# Patient Record
Sex: Male | Born: 1985 | Race: White | Hispanic: No | Marital: Single | Smoking: Former smoker
Health system: Southern US, Community
[De-identification: ages and names within clinical notes are randomized; demographics above are authoritative.]

## PROBLEM LIST (undated history)

## (undated) DIAGNOSIS — L719 Rosacea, unspecified: Secondary | ICD-10-CM

## (undated) HISTORY — PX: TONSILLECTOMY: SUR1361

## (undated) HISTORY — DX: Rosacea, unspecified: L71.9

## (undated) HISTORY — PX: WRIST SURGERY: SHX841

## (undated) HISTORY — PX: APPENDECTOMY: SHX54

---

## 2003-01-06 ENCOUNTER — Emergency Department (HOSPITAL_COMMUNITY): Admission: EM | Admit: 2003-01-06 | Discharge: 2003-01-06 | Payer: Self-pay | Admitting: Emergency Medicine

## 2009-01-16 ENCOUNTER — Encounter: Admission: RE | Admit: 2009-01-16 | Discharge: 2009-01-16 | Payer: Self-pay | Admitting: Chiropractic Medicine

## 2009-01-17 ENCOUNTER — Emergency Department (HOSPITAL_COMMUNITY): Admission: EM | Admit: 2009-01-17 | Discharge: 2009-01-17 | Payer: Self-pay | Admitting: Emergency Medicine

## 2009-01-21 ENCOUNTER — Ambulatory Visit (HOSPITAL_BASED_OUTPATIENT_CLINIC_OR_DEPARTMENT_OTHER): Admission: RE | Admit: 2009-01-21 | Discharge: 2009-01-21 | Payer: Self-pay | Admitting: Orthopedic Surgery

## 2010-07-02 ENCOUNTER — Ambulatory Visit (HOSPITAL_BASED_OUTPATIENT_CLINIC_OR_DEPARTMENT_OTHER): Admission: RE | Admit: 2010-07-02 | Payer: Self-pay | Admitting: Orthopedic Surgery
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
## 2016-07-02 ENCOUNTER — Ambulatory Visit (HOSPITAL_COMMUNITY)
Admission: EM | Admit: 2016-07-02 | Discharge: 2016-07-02 | Disposition: A | Discharge status: Home / Self Care | Payer: BLUE CROSS/BLUE SHIELD | Attending: Internal Medicine | Admitting: Internal Medicine

## 2016-07-02 ENCOUNTER — Ambulatory Visit (INDEPENDENT_AMBULATORY_CARE_PROVIDER_SITE_OTHER): Payer: BLUE CROSS/BLUE SHIELD

## 2016-07-02 ENCOUNTER — Encounter (HOSPITAL_COMMUNITY): Payer: Self-pay | Admitting: *Deleted

## 2016-07-02 DIAGNOSIS — M79641 Pain in right hand: Secondary | ICD-10-CM

## 2016-07-02 DIAGNOSIS — M79642 Pain in left hand: Secondary | ICD-10-CM | POA: Diagnosis not present

## 2016-07-02 DIAGNOSIS — S63502A Unspecified sprain of left wrist, initial encounter: Secondary | ICD-10-CM | POA: Diagnosis not present

## 2016-07-02 DIAGNOSIS — M545 Low back pain, unspecified: Secondary | ICD-10-CM

## 2016-07-02 DIAGNOSIS — W009XXA Unspecified fall due to ice and snow, initial encounter: Secondary | ICD-10-CM

## 2016-07-02 DIAGNOSIS — M25522 Pain in left elbow: Secondary | ICD-10-CM | POA: Diagnosis not present

## 2016-07-02 DIAGNOSIS — S39012A Strain of muscle, fascia and tendon of lower back, initial encounter: Secondary | ICD-10-CM

## 2016-07-02 DIAGNOSIS — M2550 Pain in unspecified joint: Secondary | ICD-10-CM

## 2016-07-02 DIAGNOSIS — M25552 Pain in left hip: Secondary | ICD-10-CM | POA: Diagnosis not present

## 2016-07-02 MED ORDER — KETOROLAC TROMETHAMINE 60 MG/2ML IM SOLN
INTRAMUSCULAR | Status: AC
  Filled 2016-07-02: qty 2

## 2016-07-02 MED ORDER — CYCLOBENZAPRINE HCL 10 MG PO TABS: 10.0000 mg | ORAL_TABLET | Freq: Two times a day (BID) | ORAL | 0 refills | Status: DC | PRN

## 2016-07-02 MED ORDER — IBUPROFEN 600 MG PO TABS: 600.0000 mg | ORAL_TABLET | Freq: Four times a day (QID) | ORAL | 0 refills | Status: DC | PRN

## 2016-07-02 MED ORDER — KETOROLAC TROMETHAMINE 60 MG/2ML IM SOLN
60.0000 mg | Freq: Once | INTRAMUSCULAR | Status: AC
  Administered 2016-07-02: 60 mg via INTRAMUSCULAR

## 2016-07-02 NOTE — ED Provider Notes (Signed)
CSN: [id number]     Arrival date & time 07/02/16  1314 History   First MD Initiated Contact with Patient 07/02/16 1500     Chief Complaint  Patient presents with  . Fall   (Consider location/radiation/quality/duration/timing/severity/associated sxs/prior Treatment) HPI  Kevin Braun is a 30 y.o. male presenting to UC with c/o multiple areas of pain after slip and fall on ice 2 days ago.  Pt notes pain has worsened so his manager at work recommended he come be evaluated.  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  Pain is worse in his Left hand and wrist, Left elbow, Left lower back and Left hip.  Pt also notes he exacerbated pain in his Right little finger from an injury 3 months ago.  He has taken tylenol but nothing else for pain.  He has been wearing a wrist splint on Left hand/wrist with mild relief. He reports having surgery on shattered Left wrist about 5-6 years ago.       History reviewed. No pertinent past medical history. Past Surgical History:  Procedure Laterality Date  . WRIST SURGERY     History reviewed. No pertinent family history. Social History  Substance Use Topics  . Smoking status: Current Some Day Smoker  . Smokeless tobacco: Not on file  . Alcohol use Yes    Review of Systems  Musculoskeletal: Positive for arthralgias, back pain, gait problem, joint swelling ( Left hand/wrist) and myalgias. Negative for neck pain and neck stiffness.  Skin: Negative for color change, rash and wound.  Neurological: Negative for weakness and numbness.    Allergies  Patient has no known allergies.  Home Medications   Prior to Admission medications   Medication Sig Start Date End Date Taking? Authorizing Provider  FLUoxetine HCl (PROZAC PO) Take by mouth.   Yes Historical Provider, MD  MINOCYCLINE HCL PO Take by mouth.   Yes Historical Provider, MD  TRAZODONE HCL PO Take by mouth.   Yes Historical Provider, MD   cyclobenzaprine (FLEXERIL) 10 MG tablet Take 1 tablet (10 mg total) by mouth 2 (two) times daily as needed for muscle spasms. 07/02/16   Junius Finner, PA-C  ibuprofen (ADVIL,MOTRIN) 600 MG tablet Take 1 tablet (600 mg total) by mouth every 6 (six) hours as needed. 07/02/16   Junius Finner, PA-C   Meds Ordered and Administered this Visit   Medications  ketorolac (TORADOL) injection 60 mg (60 mg Intramuscular Given 07/02/16 1548)    BP 132/74 (BP Location: Right Arm)   Pulse 72   Temp 98.6 F (37 C) (Oral)   Resp 16   SpO2 100%  No data found.   Physical Exam  Constitutional: He is oriented to person, place, and time. He appears well-developed and well-nourished.  HENT:  Head: Normocephalic and atraumatic.  Eyes: EOM are normal.  Neck: Normal range of motion. Neck supple.  No midline bone tenderness, no crepitus or step-offs.  Cardiovascular: Normal rate.   Pulses:      Radial pulses are 2+ on the right side, and 2+ on the left side.  Pulmonary/Chest: Effort normal.  Musculoskeletal: He exhibits edema and tenderness.  Left wrist/hand: mild edema, limited flexion and extension. Left elbow: full ROM, mild tenderness over olecranon process. Lower lumbar spine: mild tenderness, to spine and Left side lumbar muscles. Left hip: tender, full ROM w/o crepitus. Right hand: mild tenderness to little finger, slight decreased ROM, mild deformity  at 5th MCP joint.  Neurological: He is alert and oriented to person, place, and time.  Skin: Skin is warm and dry. Capillary refill takes less than 2 seconds.  Skin in tact, no ecchymosis or erythema.   Psychiatric: He has a normal mood and affect. His behavior is normal.  Nursing note and vitals reviewed.   Urgent Care Course   Clinical Course     Procedures (including critical care time)  Labs Review Labs Reviewed - No data to display  Imaging Review Dg Lumbar Spine Complete  Result Date: 07/02/2016 CLINICAL DATA:  Recent fall  on ice 2 days ago, low back pain EXAM: LUMBAR SPINE - COMPLETE 4+ VIEW COMPARISON:  07/02/2016 FINDINGS: There is no evidence of lumbar spine fracture. Alignment is normal. Intervertebral disc spaces are maintained. IMPRESSION: No acute finding or malalignment. Electronically Signed   By: Judie PetitM.  Shick M.D.   On: 07/02/2016 15:57   Dg Elbow Complete Left  Result Date: 07/02/2016 CLINICAL DATA:  Recent fall on ice, left elbow pain EXAM: LEFT ELBOW - COMPLETE 3+ VIEW COMPARISON:  07/02/2016 FINDINGS: There is no evidence of fracture, dislocation, or joint effusion. There is no evidence of arthropathy or other focal bone abnormality. Soft tissues are unremarkable. IMPRESSION: Negative. Electronically Signed   By: Judie PetitM.  Shick M.D.   On: 07/02/2016 15:59   Dg Wrist Complete Left  Result Date: 07/02/2016 CLINICAL DATA:  Recent fall on ice, left wrist pain, previous left distal radius ORIF. EXAM: LEFT WRIST - COMPLETE 3+ VIEW COMPARISON:  01/16/2009 FINDINGS: Previous left distal radius ORIF. Normal alignment. No acute osseous finding or fracture. No definite soft tissue or joint abnormality. Distal radius, ulna and carpal bones appear intact. No hardware abnormality. IMPRESSION: No acute osseous finding.  Remote left distal radius ORIF Electronically Signed   By: Judie PetitM.  Shick M.D.   On: 07/02/2016 15:56   Dg Hand Complete Left  Result Date: 07/02/2016 CLINICAL DATA:  Recent fall on ice 2 days ago, pain EXAM: LEFT HAND - COMPLETE 3+ VIEW COMPARISON:  07/02/2016 FINDINGS: Previous left distal radius ORIF. Normal alignment. No acute osseous finding. No definite soft tissue or joint abnormality. IMPRESSION: No acute osseous finding. Electronically Signed   By: Judie PetitM.  Shick M.D.   On: 07/02/2016 15:54   Dg Hand Complete Right  Result Date: 07/02/2016 CLINICAL DATA:  Recent fall on ice, right hand pain EXAM: RIGHT HAND - COMPLETE 3+ VIEW COMPARISON:  07/02/2016 FINDINGS: Healed fracture with deformity of the right fifth  metacarpal distally. No acute osseous finding or acute fracture. No soft tissue or joint abnormality. Normal alignment. No arthropathy. IMPRESSION: Healed right fifth metacarpal fracture distally with mild deformity. No acute osseous finding. Electronically Signed   By: Judie PetitM.  Shick M.D.   On: 07/02/2016 16:01   Dg Hip Unilat W Or Wo Pelvis 2-3 Views Left  Result Date: 07/02/2016 CLINICAL DATA:  Recent fall 2 days ago on ice, low back and left hip pain EXAM: DG HIP (WITH OR WITHOUT PELVIS) 2-3V LEFT COMPARISON:  07/02/2016 FINDINGS: There is no evidence of hip fracture or dislocation. There is no evidence of arthropathy or other focal bone abnormality. IMPRESSION: No acute osseous finding. Electronically Signed   By: Judie PetitM.  Shick M.D.   On: 07/02/2016 15:58     MDM   1. Fall due to slipping on ice or snow, initial encounter   2. Right hand pain   3. Left wrist sprain, initial encounter   4. Multiple joint pain  5. Acute left-sided low back pain without sciatica   6. Back strain, initial encounter    Pt c/o multiple areas of pain after slip and fall on ice about 2 days ago. Pain is worse on Left wrist and hand as well as Left lower back.  Plain films: No evidence of acute bony injury   Reassured pt no new fractures. Will treat wrist pain as a sprain. New wrist splint applied as one from home appeared to fit too tight. Home care instructions provided. Encouraged gentle stretching, alternating ice and heat. Rx: ibuprofen and flexeril (advised not to drive, operate heavy machinery or drink alcohol while taking due to drowsiness) [facility] or PCP in 1-2 weeks if not improving.   Junius FinnerErin O'Malley, PA-C 07/02/16 1640

## 2016-07-02 NOTE — ED Triage Notes (Signed)
Fell  [place] the  [employer]   2  Days  Ago     Pt  Reports   Lower back   l   Elbow   And   l wrist       And  Pain  In  The  l  Hip     Pt  Ambulated  To    Room    With    Slow  Halting  Gait

## 2016-07-02 NOTE — Discharge Instructions (Signed)
°  Flexeril is a muscle relaxer and may cause drowsiness. Do not drink alcohol, drive, or operate heavy machinery while taking.  Your x-rays today did not show any new fractures or injuries. Pain is likely due to ligament and muscle strains from the fall.  Strains and sprains typically take a few weeks to heal completely. Avoid heavy lifting or sudden movements while sore. Be sure to alternate cool and warm compresses. Cool compresses with with inflammation while warm compresses help keep muscles from tightening up. Be sure to gently warm up before doing daily stretching.  Avoid complete bed rest as that will cause muscles to stiffen up, worsening pain.  Your Left wrist is not broken so you may take the splint off throughout the day to wash your hands and do gentle stretching exercises.  If still hurting in 1-2 weeks, you should follow up with your primary care provider or orthopedist.

## 2017-06-14 ENCOUNTER — Ambulatory Visit: Payer: Self-pay | Admitting: Adult Health Nurse Practitioner

## 2017-06-14 DIAGNOSIS — M25511 Pain in right shoulder: Secondary | ICD-10-CM | POA: Insufficient documentation

## 2017-06-14 DIAGNOSIS — Z0000 Encounter for general adult medical examination without abnormal findings: Secondary | ICD-10-CM

## 2017-06-14 DIAGNOSIS — G8929 Other chronic pain: Secondary | ICD-10-CM

## 2017-06-14 MED ORDER — CYCLOBENZAPRINE HCL 5 MG PO TABS: 10.0000 mg | ORAL_TABLET | Freq: Every evening | ORAL | 0 refills | Status: DC | PRN
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
## 2017-06-15 LAB — COMPREHENSIVE METABOLIC PANEL
A/G RATIO: 1.8 (ref 1.2–2.2)
ALBUMIN: 4.9 g/dL (ref 3.5–5.5)
ALT: 27 IU/L (ref 0–44)
AST: 26 IU/L (ref 0–40)
Alkaline Phosphatase: 91 IU/L (ref 39–117)
BUN/Creatinine Ratio: 9 (ref 9–20)
BUN: 9 mg/dL (ref 6–20)
Bilirubin Total: 0.2 mg/dL (ref 0.0–1.2)
CALCIUM: 10.3 mg/dL — AB (ref 8.7–10.2)
CO2: 25 mmol/L (ref 20–29)
CREATININE: 0.98 mg/dL (ref 0.76–1.27)
Chloride: 100 mmol/L (ref 96–106)
GFR, EST AFRICAN AMERICAN: 117 mL/min/{1.73_m2} (ref >59)
GFR, EST NON AFRICAN AMERICAN: 101 mL/min/{1.73_m2} (ref >59)
GLOBULIN, TOTAL: 2.7 g/dL (ref 1.5–4.5)
Glucose: 89 mg/dL (ref 65–99)
POTASSIUM: 4.6 mmol/L (ref 3.5–5.2)
Sodium: 144 mmol/L (ref 134–144)
TOTAL PROTEIN: 7.6 g/dL (ref 6.0–8.5)

## 2017-06-15 LAB — CBC
HEMATOCRIT: 42.7 % (ref 37.5–51.0)
HEMOGLOBIN: 15.5 g/dL (ref 13.0–17.7)
MCH: 31.8 pg (ref 26.6–33.0)
MCHC: 36.3 g/dL — ABNORMAL HIGH (ref 31.5–35.7)
MCV: 88 fL (ref 79–97)
Platelets: 382 10*3/uL — ABNORMAL HIGH (ref 150–379)
RBC: 4.87 x10E6/uL (ref 4.14–5.80)
RDW: 13.1 % (ref 12.3–15.4)
WBC: 8 10*3/uL (ref 3.4–10.8)

## 2017-06-15 LAB — LIPID PANEL
Chol/HDL Ratio: 4.3 ratio (ref 0.0–5.0)
Cholesterol, Total: 187 mg/dL (ref 100–199)
HDL: 44 mg/dL (ref >39)
LDL Calculated: 90 mg/dL (ref 0–99)
TRIGLYCERIDES: 266 mg/dL — AB (ref 0–149)
VLDL CHOLESTEROL CAL: 53 mg/dL — AB (ref 5–40)

## 2017-06-15 LAB — TSH: TSH: 2.96 u[IU]/mL (ref 0.450–4.500)

## 2017-06-15 LAB — HEMOGLOBIN A1C
ESTIMATED AVERAGE GLUCOSE: 103 mg/dL
HEMOGLOBIN A1C: 5.2 % (ref 4.8–5.6)

## 2017-06-27 ENCOUNTER — Ambulatory Visit: Payer: BLUE CROSS/BLUE SHIELD

## 2017-06-29 ENCOUNTER — Ambulatory Visit: Payer: Self-pay | Admitting: Specialist

## 2017-07-06 ENCOUNTER — Ambulatory Visit: Payer: Self-pay | Admitting: Specialist

## 2017-07-06 DIAGNOSIS — M25519 Pain in unspecified shoulder: Secondary | ICD-10-CM
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
## 2017-07-13 ENCOUNTER — Ambulatory Visit: Payer: Self-pay | Admitting: Pharmacy Technician

## 2017-07-13 NOTE — Progress Notes (Signed)
Patient scheduled for eligibility appointment at [facility].  Patient did not show for the appointment on July 13, 2017 at 9:00a.m.Marland Kitchen.  Patient did not reschedule eligibility appointment.  [facility] unable to provide additional medication assistance until eligibility is determined.  Sherilyn DacostaBetty J. Juliza Machnik Care Manager [facility]

## 2017-07-14 ENCOUNTER — Telehealth: Payer: Self-pay | Admitting: Pharmacy Technician

## 2017-07-14 NOTE — Telephone Encounter (Signed)
Kevin MannanCarolyn Braun from [facility] that [facility] is no longer a resident at [facility].  Attempted to notify patient.  Patient needs to provide poi.  Patient has not returned phone call.  [facility] unable to provide medication assistance until patient provides current poi.  Kevin DacostaBetty J. Braun Held Care Manager [facility]

## 2017-11-22 ENCOUNTER — Telehealth: Payer: Self-pay | Admitting: Adult Health Nurse Practitioner

## 2017-11-22 NOTE — Telephone Encounter (Signed)
Individual from ministry called to give new phone number for the patient. Phone number has been updated in the system.

## 2019-11-08 ENCOUNTER — Ambulatory Visit
Admission: EM | Admit: 2019-11-08 | Discharge: 2019-11-08 | Disposition: A | Discharge status: Home / Self Care | Payer: 59 | Attending: Emergency Medicine | Admitting: Emergency Medicine

## 2019-11-08 ENCOUNTER — Ambulatory Visit (INDEPENDENT_AMBULATORY_CARE_PROVIDER_SITE_OTHER): Payer: 59

## 2019-11-08 DIAGNOSIS — M79641 Pain in right hand: Secondary | ICD-10-CM | POA: Diagnosis not present

## 2019-11-08 DIAGNOSIS — S6721XA Crushing injury of right hand, initial encounter: Secondary | ICD-10-CM

## 2019-11-08 DIAGNOSIS — W278XXA Contact with other nonpowered hand tool, initial encounter: Secondary | ICD-10-CM

## 2019-11-08 MED ORDER — NAPROXEN 500 MG PO TABS: 500.0000 mg | ORAL_TABLET | Freq: Two times a day (BID) | ORAL | 0 refills | Status: DC

## 2019-11-08 NOTE — ED Provider Notes (Signed)
[facility]    CSN: [id number] Arrival date & time: 11/08/19  1930      History   Chief Complaint Chief Complaint  Patient presents with  . Hand Pain    HPI Kevin Braun is a 34 y.o. male presenting for right hand pain.  States he had a crushing injury with a shovel at work today.  Last tetanus approximately 5 years ago.  Has not take anything for this.  Denies anticoagulant/blood thinner use.   History reviewed. No pertinent past medical history.  Patient Active Problem List   Diagnosis Date Noted  . Shoulder pain, right 06/14/2017  . Health care maintenance 06/14/2017    Past Surgical History:  Procedure Laterality Date  . WRIST SURGERY         Home Medications    Prior to Admission medications   Medication Sig Start Date End Date Taking? Authorizing Provider  naproxen (NAPROSYN) 500 MG tablet Take 1 tablet (500 mg total) by mouth 2 (two) times daily. 11/08/19   Hall-Potvin, [place], PA-C    Family History No family history on file.  Social History Social History   Tobacco Use  . Smoking status: Never Smoker  . Smokeless tobacco: Never Used  Substance Use Topics  . Alcohol use: Not Currently  . Drug use: Never     Allergies   Patient has no known allergies.   Review of Systems As per HPI   Physical Exam Triage Vital Signs ED Triage Vitals  Enc Vitals Group     BP      Pulse      Resp      Temp      Temp src      SpO2      Weight      Height      Head Circumference      Peak Flow      Pain Score      Pain Loc      Pain Edu?      Excl. in [place]?    No data found.  Updated Vital Signs BP (!) 134/93 (BP Location: Left Arm)   Pulse 81   Temp 98 F (36.7 C) (Oral)   Resp 18   SpO2 98%   Visual Acuity Right Eye Distance:   Left Eye Distance:   Bilateral Distance:    Right Eye Near:   Left Eye Near:    Bilateral Near:     Physical Exam Constitutional:      General: He is not in acute distress. HENT:     Head: Normocephalic and atraumatic.  Eyes:     General: No scleral icterus.    Pupils: Pupils are equal, round, and reactive to light.  Cardiovascular:     Rate and Rhythm: Normal rate.  Pulmonary:     Effort: Pulmonary effort is normal. No respiratory distress.     Breath sounds: No wheezing.  Musculoskeletal:        General: Swelling, tenderness and signs of injury present.     Comments: Decreased ROM of all fingers and right hand second to pain.  Patient does have mild swelling diffusely through right fifth digit as compared to other fingers.  Superficial abrasions noted.  No deformity or numbness.  Skin:    Capillary Refill: Capillary refill takes less than 2 seconds.     Coloration: Skin is not jaundiced or pale.  Neurological:     General: No focal deficit present.  Mental Status: He is alert and oriented to person, place, and time.      UC Treatments / Results  Labs (all labs ordered are listed, but only abnormal results are displayed) Labs Reviewed - No data to display  EKG   Radiology DG Hand Complete Right  Result Date: 11/08/2019 CLINICAL DATA:  Right hand pain after crushing injury EXAM: RIGHT HAND - COMPLETE 3+ VIEW COMPARISON:  07/02/2016 FINDINGS: There is no evidence of fracture or dislocation. There is no evidence of arthropathy or other focal bone abnormality. Soft tissues are unremarkable. IMPRESSION: Negative. Electronically Signed   By: Ulyses Jarred M.D.   On: 11/08/2019 20:10    Procedures Procedures (including critical care time)  Medications Ordered in UC Medications - No data to display  Initial Impression / Assessment and Plan / UC Course  I have reviewed the triage vital signs and the nursing notes.  Pertinent labs & imaging results that were available during my care of the patient were reviewed by me and considered in my medical decision making (see chart for details).     Patient status post crush injury, works with his hands  frequently.  Patient declined tetanus booster.  X-ray of right hand obtained in office, reviewed by me and radiology: No evidence of fracture or dislocation.  Relayed findings to patient verbalized understanding.  Ice applied during visit, will use NSAIDs, and follow-up with sports medicine if needed.  Return precautions discussed, patient verbalized understanding and is agreeable to plan. Final Clinical Impressions(s) / UC Diagnoses   Final diagnoses:  Crushing injury of right hand, initial encounter     Discharge Instructions     Take Aleve twice daily with food. Important to ice every few hours, as well as after work. Return for worsening pain, swelling, inability to complete job duties.    ED Prescriptions    Medication Sig Dispense Auth. Provider   naproxen (NAPROSYN) 500 MG tablet Take 1 tablet (500 mg total) by mouth 2 (two) times daily. 30 tablet Hall-Potvin, [place], PA-C     I have reviewed the PDMP during this encounter.   Hall-Potvin, [place], [place] 11/08/19 2029

## 2019-11-08 NOTE — Discharge Instructions (Signed)
Take Aleve twice daily with food. Important to ice every few hours, as well as after work. Return for worsening pain, swelling, inability to complete job duties.

## 2019-11-08 NOTE — ED Triage Notes (Signed)
Pt c/o rt hand pain. Pt states hit his rt 5th digit while shoveling at work yesterday.

## 2020-01-08 ENCOUNTER — Ambulatory Visit
Admission: EM | Admit: 2020-01-08 | Discharge: 2020-01-08 | Disposition: A | Discharge status: Home / Self Care | Payer: 59

## 2020-01-08 ENCOUNTER — Other Ambulatory Visit: Payer: Self-pay

## 2020-01-08 ENCOUNTER — Encounter: Payer: Self-pay | Admitting: Emergency Medicine

## 2020-01-08 DIAGNOSIS — M25561 Pain in right knee: Secondary | ICD-10-CM | POA: Diagnosis not present

## 2020-01-08 NOTE — ED Notes (Signed)
Spoke to [employer], [place].  Yu, PA reports injection would cost more than sleeve.  Notified patient .

## 2020-01-08 NOTE — ED Triage Notes (Signed)
Right knee pain for 4 days.  No known injury.  Many years ago, did get an injection in knee.  ?bursitis.  Patient reports knee buckles and pops with every step.  Bruise to inner knee.

## 2020-01-08 NOTE — Discharge Instructions (Signed)
Restart naproxen twice a day daily for 5-7 days. Ice compress. Knee sleeve during activity. If symptoms not improving, follow up with sports medicine/orthopedics for further evaluation.

## 2020-01-08 NOTE — ED Provider Notes (Signed)
[facility]    CSN: [id number] Arrival date & time: 01/08/20  1950      History   Chief Complaint Chief Complaint  Patient presents with  . Knee Pain    HPI Kevin Braun is a 34 y.o. male.   34 year old male comes in for 4 day history of right knee pain. Denies injury/trauma. No pain at rest, pain with ROM and weightbearing. Feels that his knee buckles/pops with movement. Denies radiation of pain, numbness/tingling. Contusion/swelling to the medial knee, unknown cause. Works as a Development worker, international aid with strenuous activity.      History reviewed. No pertinent past medical history.  Patient Active Problem List   Diagnosis Date Noted  . Shoulder pain, right 06/14/2017  . Health care maintenance 06/14/2017    Past Surgical History:  Procedure Laterality Date  . APPENDECTOMY    . TONSILLECTOMY    . WRIST SURGERY         Home Medications    Prior to Admission medications   Medication Sig Start Date End Date Taking? Authorizing Provider  naproxen (NAPROSYN) 500 MG tablet Take 1 tablet (500 mg total) by mouth 2 (two) times daily. 11/08/19  Yes Hall-Potvin, [place], PA-C    Family History Family History  Problem Relation Age of Onset  . Healthy Mother   . Healthy Father     Social History Social History   Tobacco Use  . Smoking status: Never Smoker  . Smokeless tobacco: Never Used  Substance Use Topics  . Alcohol use: Yes  . Drug use: Never     Allergies   Patient has no known allergies.   Review of Systems Review of Systems  Reason unable to perform ROS: See HPI as above.     Physical Exam Triage Vital Signs ED Triage Vitals  Enc Vitals Group     BP 01/08/20 0906 122/85     Pulse Rate 01/08/20 0906 79     Resp 01/08/20 0906 18     Temp 01/08/20 0906 97.7 F (36.5 C)     Temp Source 01/08/20 0906 Oral     SpO2 01/08/20 0906 98 %     Weight --      Height --      Head Circumference --      Peak Flow --      Pain Score  01/08/20 0925 6     Pain Loc --      Pain Edu? --      Excl. in [place]? --    No data found.  Updated Vital Signs BP 122/85 (BP Location: Right Arm)   Pulse 79   Temp 97.7 F (36.5 C) (Oral)   Resp 18   SpO2 98%   Physical Exam Constitutional:      General: He is not in acute distress.    Appearance: Normal appearance. He is well-developed. He is not toxic-appearing or diaphoretic.  HENT:     Head: Normocephalic and atraumatic.  Eyes:     Conjunctiva/sclera: Conjunctivae normal.     Pupils: Pupils are equal, round, and reactive to light.  Pulmonary:     Effort: Pulmonary effort is normal. No respiratory distress.     Comments: Speaking in full sentences without difficulty Musculoskeletal:     Cervical back: Normal range of motion and neck supple.     Comments: Swelling with contusion to the right medial knee. Tenderness to light palpation diffusely of the medial knee. Mild tenderness to patellar tendon.  Full ROM of knee, though with pain. Strength 5/5 with extension, 3/5 with flexion due to pain. Sensation intact.  Skin:    General: Skin is warm and dry.  Neurological:     Mental Status: He is alert and oriented to person, place, and time.      UC Treatments / Results  Labs (all labs ordered are listed, but only abnormal results are displayed) Labs Reviewed - No data to display  EKG   Radiology No results found.  Procedures Procedures (including critical care time)  Medications Ordered in UC Medications - No data to display  Initial Impression / Assessment and Plan / UC Course  I have reviewed the triage vital signs and the nursing notes.  Pertinent labs & imaging results that were available during my care of the patient were reviewed by me and considered in my medical decision making (see chart for details).    Patient with 4 day history of atraumatic knee pain, however, noticed contusion overlaying medial knee where patient's pain is present. At this time,  will start symptomatic management with NSAIDs, ice compress, knee sleeve. To follow up with ortho/sports medicine if symptoms not improving.   Final Clinical Impressions(s) / UC Diagnoses   Final diagnoses:  Acute pain of right knee   ED Prescriptions    None     I have reviewed the PDMP during this encounter.   Belinda Fisher, PA-C 01/08/20 1004

## 2020-01-08 NOTE — ED Notes (Signed)
Declined knee sleeve  Asking about cortisone injection

## 2020-01-08 NOTE — ED Notes (Signed)
Extensive discharge.  Patient called insurance carrier asking about price of sleeve, then called drug store to verify expense. Patient did decide he wanted sleeve

## 2020-01-16 ENCOUNTER — Other Ambulatory Visit: Payer: Self-pay

## 2020-01-16 ENCOUNTER — Ambulatory Visit: Payer: Self-pay

## 2020-01-16 ENCOUNTER — Encounter: Payer: Self-pay | Admitting: Family Medicine

## 2020-01-16 ENCOUNTER — Ambulatory Visit (INDEPENDENT_AMBULATORY_CARE_PROVIDER_SITE_OTHER): Payer: 59

## 2020-01-16 ENCOUNTER — Ambulatory Visit (INDEPENDENT_AMBULATORY_CARE_PROVIDER_SITE_OTHER): Payer: 59 | Admitting: Family Medicine

## 2020-01-16 VITALS — BP 110/84 | HR 91 | Ht 74.0 in | Wt 207.2 lb

## 2020-01-16 DIAGNOSIS — M25561 Pain in right knee: Secondary | ICD-10-CM

## 2020-01-16 NOTE — Progress Notes (Signed)
Subjective:    CC: R knee pain  I, Molly Weber, LAT, ATC, am serving as scribe for Dr. Clementeen Graham.  HPI: Pt is a 34 y/o male presenting w/ c/o R knee pain x approximately one week w/ no known MOI.  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  He locates his pain to his R medial knee.  He has a prior injury to this same knee when he was in basic training w/ the [employer].  Radiating pain: no R knee swelling: yes R knee mechanical symptoms: yes w/ feeling of instability. Aggravating factors: R knee AROM; weight-bearing/walking; work as a Administrator Treatments tried: RICE; Naproxen; knee brace from UC  Pertinent review of Systems: no fever or chills  Relevant historical information: Acne. Takes minocycline.    Objective:    Vitals:   01/16/20 1227  BP: 110/84  Pulse: 91  SpO2: 98%   General: Well Developed, well nourished, and in no acute distress.   MSK: Right knee mild swelling at medial knee.  No erythema.  Normal-appearing otherwise. Range of motion 5-70 degrees. Tender palpation medial joint line. Significant guarding with ligament exam testing nondiagnostic. Guarding with McMurray's testing nondiagnostic. Antalgic gait. Intact strength.  Lab and Radiology Results  X-ray images right knee obtained today personally and independently reviewed No acute fractures.  Minimal degenerative changes. Await formal radiology review  Diagnostic Limited MSK Ultrasound of: Right knee Quad tendon intact normal-appearing Minimal to trace joint effusion superior patellar space. Patellar tendon normal. Lateral joint line normal. Medial joint line small parameniscal cyst/effusion at medial joint line.  Meniscus without visible obvious tear. Posterior knee no Baker's cyst. Pes anserine area no significant bursitis Impression: Possible medial parameniscal cyst   Procedure: Real-time Ultrasound Guided Injection of right  knee superior patellar space laterally Device: Philips Affiniti 50G Images permanently stored and available for review in the ultrasound unit. Verbal informed consent obtained.  Discussed risks and benefits of procedure. Warned about infection bleeding damage to structures skin hypopigmentation and fat atrophy among others. Patient expresses understanding and agreement Time-out conducted.   Noted no overlying erythema, induration, or other signs of local infection.   Skin prepped in a sterile fashion.   Local anesthesia: Topical Ethyl chloride.   With sterile technique and under real time ultrasound guidance:  40 mg of Kenalog and 2 mL of Marcaine injected easily.   Completed without difficulty   Pain partially resolved suggesting accurate placement of the medication.   Advised to call if fevers/chills, erythema, induration, drainage, or persistent bleeding.   Images permanently stored and available for review in the ultrasound unit.  Impression: Technically successful ultrasound guided injection.       Impression and Recommendations:    Assessment and Plan: 34 y.o. male with right knee pain.  Ongoing for a few days without obvious cause.  Physical exam concerning for large meniscus tear possible bucket-handle.  Ultrasound does show what looks like a perimeniscal cyst but I do not see a definitive meniscus tear on ultrasound at medial joint line.  Doubtful for infection or gout given absence of effusion.  Plan for trial of intra-articular steroid injection and continued naproxen and knee brace.  We will go ahead and order an MRI of the knee and schedule it for 1 to 2 weeks into the future.  If not rapidly improving will proceed with MRI as patient cannot work and has significant mechanical symptoms.  MRI will be necessary to evaluate for meniscus tear or ligament injury.  Work note provided.Marland Kitchen  PDMP not reviewed this encounter. Orders Placed This Encounter  Procedures  . [place] LIMITED JOINT  SPACE STRUCTURES LOW RIGHT(NO LINKED CHARGES)    Order Specific Question:   Reason for Exam (SYMPTOM  OR DIAGNOSIS REQUIRED)    Answer:   R knee pain    Order Specific Question:   Preferred imaging location?    Answer:   [facility]ult [facility]  . DG Knee AP/LAT W/Sunrise Right    Standing Status:   Future    Number of Occurrences:   1    Standing Expiration Date:   01/15/2021    Order Specific Question:   Reason for Exam (SYMPTOM  OR DIAGNOSIS REQUIRED)    Answer:   eval knee pain    Order Specific Question:   Preferred imaging location?    Answer:   [facility]    Order Specific Question:   Radiology Contrast Protocol - do NOT remove file path    Answer:   \\[email or web address].pdf  . MR Knee Right Wo Contrast    Standing Status:   Future    Standing Expiration Date:   01/15/2021    Order Specific Question:   What is the patient's sedation requirement?    Answer:   No Sedation    Order Specific Question:   Does the patient have a pacemaker or implanted devices?    Answer:   No    Order Specific Question:   Preferred imaging location?    Answer:   [facility] (table limit-350lbs)    Order Specific Question:   Radiology Contrast Protocol - do NOT remove file path    Answer:   \\[email or web address]\Radiant\[email or web address]   No orders of the defined types were placed in this encounter.   Discussed warning signs or symptoms. Please see discharge instructions. Patient expresses understanding.   The above documentation has been reviewed and is accurate and complete Clementeen Graham, M.D.

## 2020-01-16 NOTE — Patient Instructions (Signed)
Thank you for coming in today. Continue current treatment.  Plan for xray today on the way out.  Call or go to the ER if you develop a large red swollen joint with extreme pain or oozing puss.  If not rapidly improving with injection schedule MRi for next weekend.  I will get results to you ASAP.    Meniscus Tear  A meniscus tear is a knee injury that happens when a piece of the meniscus is torn. The meniscus is a thick, rubbery, wedge-shaped cartilage in the knee. Two menisci are located in each knee. They sit between the upper bone (femur) and lower bone (tibia) that make up the knee joint. Each meniscus acts as a shock absorber for the knee. A torn meniscus is one of the most common types of knee injuries. This injury can range from mild to severe. Surgery may be needed to repair a severe tear. What are the causes? This condition may be caused by any kneeling, squatting, twisting, or pivoting movement. Sports-related injuries are the most common cause. These often occur from:  Running and stopping suddenly. ? Changing direction. ? Being tackled or knocked off your feet.  Lifting or carrying heavy weights. As people get older, their menisci get thinner and weaker. In these people, tears can happen more easily, such as from climbing stairs. What increases the risk? You are more likely to develop this condition if you:  Play contact sports.  Have a job that requires kneeling or squatting.  Are male.  Are over 20 years old. What are the signs or symptoms? Symptoms of this condition include:  Knee pain, especially at the side of the knee joint. You may feel pain when the injury occurs, or you may only hear a pop and feel pain later.  A feeling that your knee is clicking, catching, locking, or giving way.  Not being able to fully bend or extend your knee.  Bruising or swelling in your knee. How is this diagnosed? This condition may be diagnosed based on your symptoms and a  physical exam. You may also have tests, such as:  X-rays.  MRI.  A procedure to look inside your knee with a narrow surgical telescope (arthroscopy). You may be referred to a knee specialist (orthopedic surgeon). How is this treated? Treatment for this injury depends on the severity of the tear. Treatment for a mild tear may include:  Rest.  Medicine to reduce pain and swelling. This is usually a nonsteroidal anti-inflammatory drug (NSAID), like ibuprofen.  A knee brace, sleeve, or wrap.  Using crutches or a walker to keep weight off your knee and to help you walk.  Exercises to strengthen your knee (physical therapy). You may need surgery if you have a severe tear or if other treatments are not working. Follow these instructions at home: If you have a brace, sleeve, or wrap:  Wear it as told by your health care provider. Remove it only as told by your health care provider.  Loosen the brace, sleeve, or wrap if your toes tingle, become numb, or turn cold and blue.  Keep the brace, sleeve, or wrap clean and dry.  If the brace, sleeve, or wrap is not waterproof: ? Do not let it get wet. ? Cover it with a watertight covering when you take a bath or shower. Managing pain and swelling   Take over-the-counter and prescription medicines only as told by your health care provider.  If directed, put ice on your knee: ?  If you have a removable brace, sleeve, or wrap, remove it as told by your health care provider. ? Put ice in a plastic bag. ? Place a towel between your skin and the bag. ? Leave the ice on for 20 minutes, 2-3 times per day.  Move your toes often to avoid stiffness and to lessen swelling.  Raise (elevate) the injured area above the level of your heart while you are sitting or lying down. Activity  Do not use the injured limb to support your body weight until your health care provider says that you can. Use crutches or a walker as told by your health care  provider.  Return to your normal activities as told by your health care provider. Ask your health care provider what activities are safe for you.  Perform range-of-motion exercises only as told by your health care provider.  Begin doing exercises to strengthen your knee and leg muscles only as told by your health care provider. After you recover, your health care provider may recommend these exercises to help prevent another injury. General instructions  Use a knee brace, sleeve, or wrap as told by your health care provider.  Ask your health care provider when it is safe to drive if you have a brace, sleeve, or wrap on your knee.  Do not use any products that contain nicotine or tobacco, such as cigarettes, e-cigarettes, and chewing tobacco. If you need help quitting, ask your health care provider.  Ask your health care provider if the medicine prescribed to you: ? Requires you to avoid driving or using heavy machinery. ? Can cause constipation. You may need to take these actions to prevent or treat constipation:  Drink enough fluid to keep your urine pale yellow.  Take over-the-counter or prescription medicines.  Eat foods that are high in fiber, such as beans, whole grains, and fresh fruits and vegetables.  Limit foods that are high in fat and processed sugars, such as fried or sweet foods.  Keep all follow-up visits as told by your health care provider. This is important. Contact a health care provider if:  You have a fever.  Your knee becomes red, tender, or swollen.  Your pain medicine is not helping.  Your symptoms get worse or do not improve after 2 weeks of home care. Summary  A meniscus tear is a knee injury that happens when a piece of the meniscus is torn.  Treatment for this injury depends on the severity of the tear. You may need surgery if you have a severe tear or if other treatments are not working.  Rest, ice, and raise (elevate) your injured knee as told  by your health care provider. This will help lessen pain and swelling.  Contact a health care provider if you have new symptoms, or your symptoms get worse or do not improve after 2 weeks of home care.  Keep all follow-up visits as told by your health care provider. This is important. This information is not intended to replace advice given to you by your health care provider. Make sure you discuss any questions you have with your health care provider. Document Revised: 01/17/2018 Document Reviewed: 01/17/2018 Elsevier Patient Education  2020 [employer].

## 2020-01-17 NOTE — Progress Notes (Signed)
X-ray right knee shows soft tissue swelling without fracture or significant arthritis.

## 2020-02-03 ENCOUNTER — Other Ambulatory Visit: Payer: Self-pay

## 2020-02-03 ENCOUNTER — Ambulatory Visit (INDEPENDENT_AMBULATORY_CARE_PROVIDER_SITE_OTHER): Payer: 59

## 2020-02-03 ENCOUNTER — Other Ambulatory Visit: Payer: Self-pay | Admitting: Family Medicine

## 2020-02-03 DIAGNOSIS — Z1389 Encounter for screening for other disorder: Secondary | ICD-10-CM

## 2020-02-03 DIAGNOSIS — M25561 Pain in right knee: Secondary | ICD-10-CM

## 2020-02-03 DIAGNOSIS — Z0189 Encounter for other specified special examinations: Secondary | ICD-10-CM

## 2020-02-05 ENCOUNTER — Telehealth: Payer: Self-pay | Admitting: Family Medicine

## 2020-02-05 DIAGNOSIS — M25561 Pain in right knee: Secondary | ICD-10-CM

## 2020-02-05 NOTE — Progress Notes (Signed)
I called Kevin Braun to review his findings. He is having a lot of pain and having trouble walking.  MRI shows some concern for stress fracture medial femoral condyle.  Will refer to orthopedic surgery for second opinion/surgical consultation.

## 2020-02-05 NOTE — Telephone Encounter (Signed)
Refer ortho for 2nd opinion knee pain. MRI concerning for tiny medial femoral stress fracture.  Recommend crutches and reduced weightbearing.

## 2020-02-07 ENCOUNTER — Ambulatory Visit: Payer: 59 | Admitting: Surgery

## 2020-04-03 ENCOUNTER — Telehealth: Payer: Self-pay | Admitting: *Deleted

## 2020-04-03 NOTE — Telephone Encounter (Signed)
Refill denied patient needs office visit. 

## 2020-04-15 ENCOUNTER — Telehealth: Payer: Self-pay | Admitting: *Deleted

## 2020-04-15 NOTE — Telephone Encounter (Signed)
Refill denied patient needs office visit. 

## 2020-06-19 ENCOUNTER — Emergency Department (HOSPITAL_COMMUNITY)
Admission: EM | Admit: 2020-06-19 | Discharge: 2020-06-20 | Disposition: A | Discharge status: Home / Self Care | Payer: 59 | Attending: Emergency Medicine | Admitting: Emergency Medicine

## 2020-06-19 ENCOUNTER — Emergency Department (HOSPITAL_COMMUNITY): Payer: 59

## 2020-06-19 DIAGNOSIS — W08XXXA Fall from other furniture, initial encounter: Secondary | ICD-10-CM | POA: Insufficient documentation

## 2020-06-19 DIAGNOSIS — Y92511 Restaurant or cafe as the place of occurrence of the external cause: Secondary | ICD-10-CM | POA: Insufficient documentation

## 2020-06-19 DIAGNOSIS — S0990XA Unspecified injury of head, initial encounter: Secondary | ICD-10-CM

## 2020-06-19 DIAGNOSIS — R4182 Altered mental status, unspecified: Secondary | ICD-10-CM | POA: Insufficient documentation

## 2020-06-19 DIAGNOSIS — F10921 Alcohol use, unspecified with intoxication delirium: Secondary | ICD-10-CM

## 2020-06-19 DIAGNOSIS — R451 Restlessness and agitation: Secondary | ICD-10-CM | POA: Insufficient documentation

## 2020-06-19 DIAGNOSIS — W19XXXA Unspecified fall, initial encounter: Secondary | ICD-10-CM

## 2020-06-19 MED ORDER — ZIPRASIDONE MESYLATE 20 MG IM SOLR: 10.0000 mg | Freq: Once | INTRAMUSCULAR | Status: AC

## 2020-06-19 MED ORDER — STERILE WATER FOR INJECTION IJ SOLN
INTRAMUSCULAR | Status: AC
  Filled 2020-06-19: qty 10

## 2020-06-19 MED ORDER — ZIPRASIDONE MESYLATE 20 MG IM SOLR
INTRAMUSCULAR | Status: AC
  Administered 2020-06-19: 10 mg via INTRAMUSCULAR
  Filled 2020-06-19: qty 20

## 2020-06-19 NOTE — ED Notes (Addendum)
Pt incontinent urine, linens changed, condom catheter applied.

## 2020-06-19 NOTE — ED Notes (Signed)
Pt desated to 84% on room air, 2L [place] applied with improvement.

## 2020-06-19 NOTE — ED Triage Notes (Signed)
Pt BIB [facility] from a bar, pt +ETOH fell off a barstool hitting his head. EMS reports GCS 11, BP 132/80, HR 80, CBG 134

## 2020-06-19 NOTE — ED Notes (Signed)
Pt uncooperative and combative with staff. Verbal order 10mg  geodon IM from EDP.

## 2020-06-19 NOTE — Progress Notes (Signed)
Orthopedic Tech Progress Note Patient Details:  Kevin Braun January 08, 1986 [id number] Level 2 Trauma  Patient ID: Kevin Braun, male   DOB: [birth year], 34 y.o.   MRN: [id number]   Kevin Braun 06/19/2020, 9:54 PM

## 2020-06-20 LAB — CBC WITH DIFFERENTIAL/PLATELET
Abs Immature Granulocytes: 0.02 10*3/uL (ref 0.00–0.07)
Basophils Absolute: 0.1 10*3/uL (ref 0.0–0.1)
Basophils Relative: 2 %
Eosinophils Absolute: 0.1 10*3/uL (ref 0.0–0.5)
Eosinophils Relative: 1 %
HCT: 43.1 % (ref 39.0–52.0)
Hemoglobin: 13.8 g/dL (ref 13.0–17.0)
Immature Granulocytes: 0 %
Lymphocytes Relative: 46 %
Lymphs Abs: 2.3 10*3/uL (ref 0.7–4.0)
MCH: 33.3 pg (ref 26.0–34.0)
MCHC: 32 g/dL (ref 30.0–36.0)
MCV: 103.9 fL — ABNORMAL HIGH (ref 80.0–100.0)
Monocytes Absolute: 0.2 10*3/uL (ref 0.1–1.0)
Monocytes Relative: 5 %
Neutro Abs: 2.3 10*3/uL (ref 1.7–7.7)
Neutrophils Relative %: 46 %
Platelets: 496 10*3/uL — ABNORMAL HIGH (ref 150–400)
RBC: 4.15 MIL/uL — ABNORMAL LOW (ref 4.22–5.81)
RDW: 13.2 % (ref 11.5–15.5)
WBC: 5 10*3/uL (ref 4.0–10.5)
nRBC: 0 % (ref 0.0–0.2)

## 2020-06-20 LAB — COMPREHENSIVE METABOLIC PANEL
ALT: 156 U/L — ABNORMAL HIGH (ref 0–44)
AST: 97 U/L — ABNORMAL HIGH (ref 15–41)
Albumin: 3.9 g/dL (ref 3.5–5.0)
Alkaline Phosphatase: 59 U/L (ref 38–126)
Anion gap: 13 (ref 5–15)
BUN: <5 mg/dL — ABNORMAL LOW (ref 6–20)
CO2: 22 mmol/L (ref 22–32)
Calcium: 8.7 mg/dL — ABNORMAL LOW (ref 8.9–10.3)
Chloride: 109 mmol/L (ref 98–111)
Creatinine, Ser: 0.81 mg/dL (ref 0.61–1.24)
GFR, Estimated: >60 mL/min (ref >60)
Glucose, Bld: 116 mg/dL — ABNORMAL HIGH (ref 70–99)
Potassium: 3.7 mmol/L (ref 3.5–5.1)
Sodium: 144 mmol/L (ref 135–145)
Total Bilirubin: 0.4 mg/dL (ref 0.3–1.2)
Total Protein: 7 g/dL (ref 6.5–8.1)

## 2020-06-20 LAB — ETHANOL: Alcohol, Ethyl (B): 357 mg/dL (ref <10)

## 2020-06-20 MED ORDER — LACTATED RINGERS IV BOLUS
1000.0000 mL | Freq: Once | INTRAVENOUS | Status: AC
  Administered 2020-06-20: 1000 mL via INTRAVENOUS

## 2020-06-20 NOTE — Progress Notes (Signed)
   06/19/20 2117  Clinical Encounter Type  Visited With Patient not available  Visit Type Trauma  Referral From Nurse  Consult/Referral To Chaplain   Chaplain responded to Level 2 trauma. Pt being treated. No current need for chaplain. Chaplain remains available as needed.  This note was prepared by Chaplain Resident, Tacy Learn, MDiv. Chaplain remains available as needed through the on-call pager: [phone].
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
## 2020-06-20 NOTE — ED Notes (Signed)
Patient much more  Alert standing steady up at bedside, A/O. States he is ready to leave.
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
## 2020-06-20 NOTE — Discharge Instructions (Addendum)
Please limit your alcohol consumption to no more than two drinks in a day.

## 2020-08-11 ENCOUNTER — Other Ambulatory Visit: Payer: 59

## 2020-11-26 ENCOUNTER — Other Ambulatory Visit: Payer: Self-pay | Admitting: Physician Assistant

## 2020-12-13 ENCOUNTER — Ambulatory Visit (INDEPENDENT_AMBULATORY_CARE_PROVIDER_SITE_OTHER): Payer: 59

## 2020-12-13 ENCOUNTER — Ambulatory Visit
Admission: EM | Admit: 2020-12-13 | Discharge: 2020-12-13 | Disposition: A | Discharge status: Home / Self Care | Payer: 59 | Attending: Emergency Medicine | Admitting: Emergency Medicine

## 2020-12-13 ENCOUNTER — Encounter: Payer: Self-pay | Admitting: Emergency Medicine

## 2020-12-13 ENCOUNTER — Other Ambulatory Visit: Payer: Self-pay

## 2020-12-13 DIAGNOSIS — S62652A Nondisplaced fracture of medial phalanx of right middle finger, initial encounter for closed fracture: Secondary | ICD-10-CM | POA: Diagnosis not present

## 2020-12-13 DIAGNOSIS — S62654A Nondisplaced fracture of medial phalanx of right ring finger, initial encounter for closed fracture: Secondary | ICD-10-CM

## 2020-12-13 DIAGNOSIS — M79641 Pain in right hand: Secondary | ICD-10-CM

## 2020-12-13 MED ORDER — NAPROXEN 500 MG PO TABS: 500.0000 mg | ORAL_TABLET | Freq: Two times a day (BID) | ORAL | 0 refills | Status: DC

## 2020-12-13 NOTE — Discharge Instructions (Addendum)
Please wear finger splints and follow-up with orthopedics/sports medicine-contact below Naproxen twice daily for pain and swelling May ice fingers Vitamin D3 supplement over the counter

## 2020-12-13 NOTE — ED Provider Notes (Signed)
[facility]    CSN: [id number] Arrival date & time: 12/13/20  1308      History   Chief Complaint Chief Complaint  Patient presents with  . Hand Pain    HPI Kevin Braun is a 35 y.o. male presenting today for evaluation of right hand injury.  Patient reports falling approximately 2 weeks ago.  Reports that his right hand and fingers were extended and made impact to the ground causing hyperextension.  Since he has had pain and swelling mainly to his third and fourth fingers around his PIP and discomfort at his MCP joints.  Reports initially symptoms were improving, but had never fully resolved.  He has regained some range of motion, but cannot fully flex at his PIP.  HPI  History reviewed. No pertinent past medical history.  Patient Active Problem List   Diagnosis Date Noted  . Shoulder pain, right 06/14/2017  . Health care maintenance 06/14/2017    Past Surgical History:  Procedure Laterality Date  . APPENDECTOMY    . TONSILLECTOMY    . WRIST SURGERY         Home Medications    Prior to Admission medications   Medication Sig Start Date End Date Taking? Authorizing Provider  naproxen (NAPROSYN) 500 MG tablet Take 1 tablet (500 mg total) by mouth 2 (two) times daily. 12/13/20  Yes Garin Mata, Junius Creamer, PA-C    Family History Family History  Problem Relation Age of Onset  . Healthy Mother   . Healthy Father     Social History Social History   Tobacco Use  . Smoking status: Never Smoker  . Smokeless tobacco: Never Used  Substance Use Topics  . Alcohol use: Yes  . Drug use: Never     Allergies   Patient has no known allergies.   Review of Systems Review of Systems  Constitutional: Negative for fatigue and fever.  Eyes: Negative for redness, itching and visual disturbance.  Respiratory: Negative for shortness of breath.   Cardiovascular: Negative for chest pain and leg swelling.  Gastrointestinal: Negative for nausea and vomiting.   Musculoskeletal: Positive for arthralgias and joint swelling. Negative for myalgias.  Skin: Negative for color change, rash and wound.  Neurological: Negative for dizziness, syncope, weakness, light-headedness and headaches.     Physical Exam Triage Vital Signs ED Triage Vitals  Enc Vitals Group     BP      Pulse      Resp      Temp      Temp src      SpO2      Weight      Height      Head Circumference      Peak Flow      Pain Score      Pain Loc      Pain Edu?      Excl. in GC?    No data found.  Updated Vital Signs BP 113/78 (BP Location: Right Arm)   Pulse 85   Temp 98.1 F (36.7 C) (Oral)   Resp 18   SpO2 100%   Visual Acuity Right Eye Distance:   Left Eye Distance:   Bilateral Distance:    Right Eye Near:   Left Eye Near:    Bilateral Near:     Physical Exam Vitals and nursing note reviewed.  Constitutional:      Appearance: He is well-developed.     Comments: No acute distress  HENT:  Head: Normocephalic and atraumatic.     Nose: Nose normal.  Eyes:     Conjunctiva/sclera: Conjunctivae normal.  Cardiovascular:     Rate and Rhythm: Normal rate.  Pulmonary:     Effort: Pulmonary effort is normal. No respiratory distress.  Abdominal:     General: There is no distension.  Musculoskeletal:        General: Normal range of motion.     Cervical back: Neck supple.     Comments: Right hand with swelling and mild bruising noted to proximal and middle phalanx around PIP of third and fourth fingers, slightly limited range of motion at PIP, full active range of motion at DIP, tender to palpation around PIP Nontender throughout dorsum of hand  Skin:    General: Skin is warm and dry.  Neurological:     Mental Status: He is alert and oriented to person, place, and time.      UC Treatments / Results  Labs (all labs ordered are listed, but only abnormal results are displayed) Labs Reviewed - No data to display  EKG   Radiology DG Hand  Complete Right  Result Date: 12/13/2020 CLINICAL DATA:  35 year old male with trauma to the right hand. EXAM: RIGHT HAND - COMPLETE 3+ VIEW COMPARISON:  Right hand radiograph dated 11/08/2019. FINDINGS: There is an avulsion fracture of the volar base of the middle phalanx of the third digit. An avulsion fracture of the volar base of the middle phalanx of the fourth digit may also be present. No other acute fracture. There is no dislocation. Mild juxta-articular osteopenia. The soft tissues are grossly unremarkable. IMPRESSION: Avulsion fracture of the volar base of the middle phalanx of the third and possibly fourth digits. Electronically Signed   By: Elgie Collard M.D.   On: 12/13/2020 15:06    Procedures Procedures (including critical care time)  Medications Ordered in UC Medications - No data to display  Initial Impression / Assessment and Plan / UC Course  I have reviewed the triage vital signs and the nursing notes.  Pertinent labs & imaging results that were available during my care of the patient were reviewed by me and considered in my medical decision making (see chart for details).     Middle phalanx fractures of 3rd and 4th right fingers- Placing in static splint and buddy taping, in slight flexion, follow up with sports medicine/hand for follow up. Discussed strict return precautions. Patient verbalized understanding and is agreeable with plan.  Osteopenia noted on imaging, patient reports remote history of vitamin D deficiency, encouraged D3 supplementation over-the-counter and follow-up with PCP to have this checked.  Discussed strict return precautions. Patient verbalized understanding and is agreeable with plan.  Final Clinical Impressions(s) / UC Diagnoses   Final diagnoses:  Nondisplaced fracture of middle phalanx of right middle finger, initial encounter for closed fracture  Closed nondisplaced fracture of middle phalanx of right ring finger, initial encounter      Discharge Instructions     Please wear finger splints and follow-up with orthopedics/sports medicine-contact below Naproxen twice daily for pain and swelling May ice fingers Vitamin D3 supplement over the counter    ED Prescriptions    Medication Sig Dispense Auth. Provider   naproxen (NAPROSYN) 500 MG tablet Take 1 tablet (500 mg total) by mouth 2 (two) times daily. 30 tablet Jiles Goya, Remy C, PA-C     PDMP not reviewed this encounter.   Lew Dawes, [place] 12/13/20 1530

## 2020-12-13 NOTE — ED Triage Notes (Signed)
Pt sts right hand pain and finger pain after falling and landing on hand 2 weeks ago; pt sts some swelling

## 2020-12-23 ENCOUNTER — Encounter: Payer: Self-pay | Admitting: Physician Assistant

## 2020-12-23 ENCOUNTER — Ambulatory Visit (INDEPENDENT_AMBULATORY_CARE_PROVIDER_SITE_OTHER): Payer: 59 | Admitting: Physician Assistant

## 2020-12-23 ENCOUNTER — Other Ambulatory Visit: Payer: Self-pay

## 2020-12-23 DIAGNOSIS — L719 Rosacea, unspecified: Secondary | ICD-10-CM

## 2020-12-23 MED ORDER — MINOCYCLINE HCL 100 MG PO CAPS
100.0000 mg | ORAL_CAPSULE | Freq: Two times a day (BID) | ORAL | 11 refills | Status: DC
  Filled 2021-10-19: qty 50, 25d supply, fill #0

## 2020-12-23 NOTE — Progress Notes (Signed)
   Follow-Up Visit   Subjective  Kevin Braun is a 35 y.o. male who presents for the following: Acne (Patient here today for refill on [facility] per patient the current treatment works wonders. No personal history or family history of atypical moles, melanoma or non mole skin cancer. ). He has been off of the medication for a week and is flaring.  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   The following portions of the chart were reviewed this encounter and updated as appropriate:  Tobacco  Allergies  Meds  Problems  Med Hx  Surg Hx  Fam Hx      Objective  Well appearing patient in no apparent distress; mood and affect are within normal limits.  All skin waist up examined.  Objective  Central Face: Erythema with papules  Assessment & Plan  Rosacea Central Face  minocycline (MINOCIN) 100 MG capsule - Central Face  Discussed the long term side effects. Patient understands and will call if any problems. He was given samples of mineral sunscreen to use while outside. I also recommended that he wear a wide brimmed hat.  I, Aalyssa Elderkin, PA-C, have reviewed all documentation's for this visit.  The documentation on 12/23/20 for the exam, diagnosis, procedures and orders are all accurate and complete.

## 2021-02-03 ENCOUNTER — Ambulatory Visit: Payer: 59 | Admitting: Physician Assistant

## 2021-05-16 ENCOUNTER — Other Ambulatory Visit: Payer: Self-pay

## 2021-05-16 ENCOUNTER — Emergency Department (HOSPITAL_COMMUNITY)
Admission: EM | Admit: 2021-05-16 | Discharge: 2021-05-17 | Disposition: A | Discharge status: Home / Self Care | Payer: 59 | Attending: Emergency Medicine | Admitting: Emergency Medicine

## 2021-05-16 ENCOUNTER — Encounter (HOSPITAL_COMMUNITY): Payer: Self-pay | Admitting: Emergency Medicine

## 2021-05-16 ENCOUNTER — Emergency Department (HOSPITAL_COMMUNITY): Payer: 59

## 2021-05-16 ENCOUNTER — Ambulatory Visit (HOSPITAL_COMMUNITY)
Admission: EM | Admit: 2021-05-16 | Discharge: 2021-05-16 | Disposition: A | Discharge status: Home / Self Care | Payer: 59

## 2021-05-16 DIAGNOSIS — Z23 Encounter for immunization: Secondary | ICD-10-CM | POA: Insufficient documentation

## 2021-05-16 DIAGNOSIS — R059 Cough, unspecified: Secondary | ICD-10-CM | POA: Diagnosis not present

## 2021-05-16 DIAGNOSIS — R0789 Other chest pain: Secondary | ICD-10-CM | POA: Insufficient documentation

## 2021-05-16 DIAGNOSIS — S0990XA Unspecified injury of head, initial encounter: Secondary | ICD-10-CM | POA: Diagnosis not present

## 2021-05-16 DIAGNOSIS — S2232XA Fracture of one rib, left side, initial encounter for closed fracture: Secondary | ICD-10-CM

## 2021-05-16 DIAGNOSIS — F10920 Alcohol use, unspecified with intoxication, uncomplicated: Secondary | ICD-10-CM

## 2021-05-16 DIAGNOSIS — F10129 Alcohol abuse with intoxication, unspecified: Secondary | ICD-10-CM | POA: Diagnosis not present

## 2021-05-16 DIAGNOSIS — Z20822 Contact with and (suspected) exposure to covid-19: Secondary | ICD-10-CM | POA: Insufficient documentation

## 2021-05-16 DIAGNOSIS — S299XXA Unspecified injury of thorax, initial encounter: Secondary | ICD-10-CM | POA: Diagnosis present

## 2021-05-16 DIAGNOSIS — S3991XA Unspecified injury of abdomen, initial encounter: Secondary | ICD-10-CM | POA: Diagnosis not present

## 2021-05-16 LAB — CBC WITH DIFFERENTIAL/PLATELET
Abs Immature Granulocytes: 0.01 10*3/uL (ref 0.00–0.07)
Basophils Absolute: 0.1 10*3/uL (ref 0.0–0.1)
Basophils Relative: 1 %
Eosinophils Absolute: 0 10*3/uL (ref 0.0–0.5)
Eosinophils Relative: 1 %
HCT: 43.7 % (ref 39.0–52.0)
Hemoglobin: 14.7 g/dL (ref 13.0–17.0)
Immature Granulocytes: 0 %
Lymphocytes Relative: 39 %
Lymphs Abs: 2.2 10*3/uL (ref 0.7–4.0)
MCH: 33.4 pg (ref 26.0–34.0)
MCHC: 33.6 g/dL (ref 30.0–36.0)
MCV: 99.3 fL (ref 80.0–100.0)
Monocytes Absolute: 0.4 10*3/uL (ref 0.1–1.0)
Monocytes Relative: 8 %
Neutro Abs: 3 10*3/uL (ref 1.7–7.7)
Neutrophils Relative %: 51 %
Platelets: 240 10*3/uL (ref 150–400)
RBC: 4.4 MIL/uL (ref 4.22–5.81)
RDW: 13 % (ref 11.5–15.5)
WBC: 5.8 10*3/uL (ref 4.0–10.5)
nRBC: 0 % (ref 0.0–0.2)

## 2021-05-16 LAB — COMPREHENSIVE METABOLIC PANEL
ALT: 87 U/L — ABNORMAL HIGH (ref 0–44)
AST: 192 U/L — ABNORMAL HIGH (ref 15–41)
Albumin: 4.7 g/dL (ref 3.5–5.0)
Alkaline Phosphatase: 124 U/L (ref 38–126)
Anion gap: 16 — ABNORMAL HIGH (ref 5–15)
BUN: 5 mg/dL — ABNORMAL LOW (ref 6–20)
CO2: 24 mmol/L (ref 22–32)
Calcium: 9.8 mg/dL (ref 8.9–10.3)
Chloride: 101 mmol/L (ref 98–111)
Creatinine, Ser: 0.77 mg/dL (ref 0.61–1.24)
GFR, Estimated: >60 mL/min (ref >60)
Glucose, Bld: 99 mg/dL (ref 70–99)
Potassium: 4.5 mmol/L (ref 3.5–5.1)
Sodium: 141 mmol/L (ref 135–145)
Total Bilirubin: 1 mg/dL (ref 0.3–1.2)
Total Protein: 8.6 g/dL — ABNORMAL HIGH (ref 6.5–8.1)

## 2021-05-16 LAB — I-STAT CHEM 8, ED
BUN: 8 mg/dL (ref 6–20)
Calcium, Ion: 1.09 mmol/L — ABNORMAL LOW (ref 1.15–1.40)
Chloride: 102 mmol/L (ref 98–111)
Creatinine, Ser: 1.1 mg/dL (ref 0.61–1.24)
Glucose, Bld: 91 mg/dL (ref 70–99)
HCT: 45 % (ref 39.0–52.0)
Hemoglobin: 15.3 g/dL (ref 13.0–17.0)
Potassium: 4.2 mmol/L (ref 3.5–5.1)
Sodium: 142 mmol/L (ref 135–145)
TCO2: 27 mmol/L (ref 22–32)

## 2021-05-16 LAB — URINALYSIS, ROUTINE W REFLEX MICROSCOPIC
Bacteria, UA: NONE SEEN
Bilirubin Urine: NEGATIVE
Glucose, UA: NEGATIVE mg/dL
Hgb urine dipstick: NEGATIVE
Ketones, ur: 20 mg/dL — AB
Leukocytes,Ua: NEGATIVE
Nitrite: NEGATIVE
Protein, ur: 30 mg/dL — AB
Specific Gravity, Urine: 1.028 (ref 1.005–1.030)
pH: 5 (ref 5.0–8.0)

## 2021-05-16 LAB — SAMPLE TO BLOOD BANK

## 2021-05-16 LAB — RESP PANEL BY RT-PCR (FLU A&B, COVID) ARPGX2
Influenza A by PCR: NEGATIVE
Influenza B by PCR: NEGATIVE
SARS Coronavirus 2 by RT PCR: NEGATIVE

## 2021-05-16 LAB — LACTIC ACID, PLASMA: Lactic Acid, Venous: 3.2 mmol/L (ref 0.5–1.9)

## 2021-05-16 LAB — ETHANOL: Alcohol, Ethyl (B): 326 mg/dL (ref <10)

## 2021-05-16 LAB — PROTIME-INR
INR: 1.1 (ref 0.8–1.2)
Prothrombin Time: 13.9 seconds (ref 11.4–15.2)

## 2021-05-16 MED ORDER — IOHEXOL 300 MG/ML  SOLN
100.0000 mL | Freq: Once | INTRAMUSCULAR | Status: AC | PRN
  Administered 2021-05-16: 100 mL via INTRAVENOUS

## 2021-05-16 MED ORDER — TETANUS-DIPHTH-ACELL PERTUSSIS 5-2.5-18.5 LF-MCG/0.5 IM SUSY
0.5000 mL | PREFILLED_SYRINGE | Freq: Once | INTRAMUSCULAR | Status: AC
  Administered 2021-05-17: 06:00:00 0.5 mL via INTRAMUSCULAR
  Filled 2021-05-16: qty 0.5
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
## 2021-05-17 NOTE — Discharge Instructions (Addendum)
For pain control you may take at 1000 mg of Tylenol every 8 hours as needed.  Incentive spirometer: Use frequently to prevent pneumonia.  Recommended usage: 10 deep inhalations through spirometer every 2-3 hours for 7-10 days.  During the workup we noted incidental findings on your imaging that would require you to follow-up with your regular doctor for further evaluation/management: Hypodense 15 mm left thyroid nodule. Recommend thyroid US on an  elective basis

## 2021-05-17 NOTE — ED Notes (Signed)
Pt able to demonstrate proper incentive spirometer use.

## 2021-06-17 ENCOUNTER — Encounter (HOSPITAL_COMMUNITY): Payer: Self-pay | Admitting: Radiology

## 2021-10-19 ENCOUNTER — Other Ambulatory Visit: Payer: Self-pay

## 2021-10-20 ENCOUNTER — Other Ambulatory Visit: Payer: Self-pay

## 2021-10-23 ENCOUNTER — Other Ambulatory Visit: Payer: Self-pay

## 2021-11-10 ENCOUNTER — Ambulatory Visit: Payer: Self-pay | Admitting: Gerontology

## 2021-11-10 ENCOUNTER — Other Ambulatory Visit: Payer: Self-pay

## 2021-11-10 ENCOUNTER — Encounter: Payer: Self-pay | Admitting: Gerontology

## 2021-11-10 VITALS — BP 107/72 | HR 68 | Temp 98.0°F | Ht 73.0 in | Wt 222.0 lb

## 2021-11-10 DIAGNOSIS — L719 Rosacea, unspecified: Secondary | ICD-10-CM | POA: Insufficient documentation

## 2021-11-10 DIAGNOSIS — R946 Abnormal results of thyroid function studies: Secondary | ICD-10-CM

## 2021-11-10 DIAGNOSIS — Z7689 Persons encountering health services in other specified circumstances: Secondary | ICD-10-CM | POA: Insufficient documentation

## 2021-11-10 MED ORDER — MINOCYCLINE HCL 100 MG PO CAPS
100.0000 mg | ORAL_CAPSULE | Freq: Two times a day (BID) | ORAL | 11 refills | Status: DC
  Filled 2021-11-10: qty 60, 30d supply, fill #0
  Filled 2021-11-18 – 2021-12-15 (×2): qty 50, 25d supply, fill #0
  Filled 2022-01-08: qty 60, 30d supply, fill #1
  Filled 2022-02-17: qty 60, 30d supply, fill #2

## 2021-11-10 NOTE — Progress Notes (Signed)
? ?New Patient Office Visit ? ?Subjective   ? ?Patient ID: Kevin Braun, male    DOB: 11-May-1986  Age: 36 y.o. MRN: [id number] ? ?CC:  ?Chief Complaint  ?Patient presents with  ? Establish Care  ?  No new complaints. Wants a general check up. Remember hearing something about a low Vitamin D and K levels at one point. Has questions about abdnormal CT scan of thyroid done at [facility] on 05/16/2021.   ? ? ?HPI ?Kevin Braun is a 36 y/o male who has no significant past medical history presents to establish care. He states that he had Abdominal /Pelvis CT scan done on 05/16/22 and his left thyroid nodule measured 15 mm and thyroid ultrasound was recommended.  He denies heat nor cold intolerance, and constipation. He has history of adult acne and rosacea and was prescribed Minocycline 100 mg bid by Dermatologist. 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 Overall, he states that he's doing well and offers no further complaint. ? ?Outpatient Encounter Medications as of 11/10/2021  ?Medication Sig  ? [DISCONTINUED] minocycline (MINOCIN) 100 MG capsule Take 1 capsule (100 mg total) by mouth 2 (two) times daily. (Patient taking differently: Take 100 mg by mouth 2 (two) times daily as needed (breakouts).)  ? minocycline (MINOCIN) 100 MG capsule Take 1 capsule (100 mg total) by mouth 2 (two) times daily.  ? [DISCONTINUED] naproxen (NAPROSYN) 500 MG tablet Take 1 tablet (500 mg total) by mouth 2 (two) times daily. (Patient not taking: Reported on 11/10/2021)  ? ?No facility-administered encounter medications on file as of 11/10/2021.  ? ? ?History reviewed. No pertinent past medical history. ? ?Past Surgical History:  ?Procedure Laterality Date  ? APPENDECTOMY    ? TONSILLECTOMY    ? WRIST SURGERY    ? ? ?Family History  ?Problem Relation Age of Onset  ? Healthy Mother   ? Healthy Father   ? ? ?Social History  ? ?Socioeconomic History  ? Marital status: Single  ?  Spouse name: Not on file  ? Number of  children: Not on file  ? Years of education: Not on file  ? Highest education level: Not on file  ?Occupational History  ? Not on file  ?Tobacco Use  ? Smoking status: Never  ? Smokeless tobacco: Never  ?Vaping Use  ? Vaping Use: Every day  ? Substances: Nicotine, Flavoring  ?Substance and Sexual Activity  ? Alcohol use: Not Currently  ?  Comment: Last use 6+ months ago  ? Drug use: Not Currently  ?  Comment: last use 5+ years ago  ? Sexual activity: Not on file  ?Other Topics Concern  ? Not on file  ?Social History Narrative  ? Not on file  ? ?Social Determinants of Health  ? ?Financial Resource Strain: Not on file  ?Food Insecurity: No Food Insecurity  ? Worried About Charity fundraiser in the Last Year: Never true  ? Ran Out of Food in the Last Year: Never true  ?Transportation Needs: No Transportation Needs  ? Lack of Transportation (Medical): No  ? Lack of Transportation (Non-Medical): No  ?Physical Activity: Not on file  ?Stress: Not on file  ?Social Connections: Not on file  ?Intimate Partner Violence: Not on file  ? ? ?Review of Systems  ?Constitutional: Negative.   ?HENT: Negative.    ?Eyes: Negative.   ?Respiratory: Negative.    ?Cardiovascular: Negative.   ?Gastrointestinal: Negative.   ?Genitourinary: Negative.   ?  Musculoskeletal: Negative.   ?Skin: Negative.   ?Neurological: Negative.   ?Endo/Heme/Allergies: Negative.   ?Psychiatric/Behavioral: Negative.    ? ?  ? ? ?Objective   ? ?BP 107/72 (BP Location: Left Arm, Patient Position: Sitting, Cuff Size: Large)   Pulse 68   Temp 98 ?F (36.7 ?C) (Oral)   Ht _0  (1.854 m)   Wt 222 lb (100.7 kg)   SpO2 97%   BMI 29.29 kg/m?  ? ?Physical Exam ?HENT:  ?   Head: Normocephalic and atraumatic.  ?   Nose: Nose normal.  ?   Mouth/Throat:  ?   Mouth: Mucous membranes are moist.  ?Eyes:  ?   Extraocular Movements: Extraocular movements intact.  ?   Conjunctiva/sclera: Conjunctivae normal.  ?   Pupils: Pupils are equal, round, and reactive to light.   ?Cardiovascular:  ?   Rate and Rhythm: Normal rate and regular rhythm.  ?   Pulses: Normal pulses.  ?   Heart sounds: Normal heart sounds.  ?Pulmonary:  ?   Effort: Pulmonary effort is normal.  ?   Breath sounds: Normal breath sounds.  ?Abdominal:  ?   General: Abdomen is flat. Bowel sounds are normal.  ?   Palpations: Abdomen is soft.  ?Genitourinary: ?   Comments: Deferred per patient ?Musculoskeletal:     ?   General: Normal range of motion.  ?   Cervical back: Normal range of motion.  ?Skin: ?   General: Skin is warm.  ?Neurological:  ?   General: No focal deficit present.  ?   Mental Status: He is alert and oriented to person, place, and time. Mental status is at baseline.  ?Psychiatric:     ?   Mood and Affect: Mood normal.     ?   Behavior: Behavior normal.     ?   Thought Content: Thought content normal.     ?   Judgment: Judgment normal.  ? ? ? ?  ? ?Assessment & Plan:  ? ?1. Rosacea ?- He will continue on current medication and will follow up with Dermatologist. ?- minocycline (MINOCIN) 100 MG capsule; Take 1 capsule (100 mg total) by mouth 2 (two) times daily.  Dispense: 60 capsule; Refill: 11 ? ?2. Encounter to establish care ?- Routine labs will be checked ?- Comp Met (CMET); Future ?- CBC w/Diff; Future ?- Lipid panel; Future ?- Urinalysis; Future ?- HgB A1c; Future ?- Vitamin D (25 hydroxy); Future ?- TSH; Future ?- Vitamin B1; Future ?- Magnesium; Future ? ?3. Abnormal thyroid scan ?-He denies cold nor heat intolerance, will check Thyroid US as recommended by Dr Keith Rake on 05/16/22. ?- US THYROID; Future ? ? ? ?Return in about 5 weeks (around 12/15/2021), or if symptoms worsen or fail to improve.  ? ?Mechele Kittleson Jerold Coombe, NP ? ? ?

## 2021-11-18 ENCOUNTER — Other Ambulatory Visit: Payer: Self-pay

## 2021-11-19 ENCOUNTER — Telehealth: Payer: Self-pay | Admitting: Pharmacy Technician

## 2021-11-19 NOTE — Telephone Encounter (Signed)
Received updated proof of income.  Patient eligible to receive medication assistance at [facility] until time for re-certification in 2024, and as long as eligibility requirements continue to be met. ? ?Coe Angelos J. Nasira Janusz ?Care Manager ?[facility]  ?

## 2021-12-02 ENCOUNTER — Other Ambulatory Visit: Payer: Self-pay

## 2021-12-02 DIAGNOSIS — Z7689 Persons encountering health services in other specified circumstances: Secondary | ICD-10-CM

## 2021-12-06 LAB — COMPREHENSIVE METABOLIC PANEL
ALT: 27 IU/L (ref 0–44)
AST: 31 IU/L (ref 0–40)
Albumin/Globulin Ratio: 2 (ref 1.2–2.2)
Albumin: 4.7 g/dL (ref 4.0–5.0)
Alkaline Phosphatase: 101 IU/L (ref 44–121)
BUN/Creatinine Ratio: 11 (ref 9–20)
BUN: 12 mg/dL (ref 6–20)
Bilirubin Total: 0.5 mg/dL (ref 0.0–1.2)
CO2: 24 mmol/L (ref 20–29)
Calcium: 10.1 mg/dL (ref 8.7–10.2)
Chloride: 101 mmol/L (ref 96–106)
Creatinine, Ser: 1.07 mg/dL (ref 0.76–1.27)
Globulin, Total: 2.4 g/dL (ref 1.5–4.5)
Glucose: 104 mg/dL — ABNORMAL HIGH (ref 70–99)
Potassium: 4.5 mmol/L (ref 3.5–5.2)
Sodium: 138 mmol/L (ref 134–144)
Total Protein: 7.1 g/dL (ref 6.0–8.5)
eGFR: 92 mL/min/{1.73_m2} (ref >59)

## 2021-12-06 LAB — CBC WITH DIFFERENTIAL/PLATELET
Basophils Absolute: 0 10*3/uL (ref 0.0–0.2)
Basos: 0 %
EOS (ABSOLUTE): 0.1 10*3/uL (ref 0.0–0.4)
Eos: 2 %
Hematocrit: 44.7 % (ref 37.5–51.0)
Hemoglobin: 15.4 g/dL (ref 13.0–17.7)
Immature Grans (Abs): 0 10*3/uL (ref 0.0–0.1)
Immature Granulocytes: 0 %
Lymphocytes Absolute: 2.1 10*3/uL (ref 0.7–3.1)
Lymphs: 40 %
MCH: 31 pg (ref 26.6–33.0)
MCHC: 34.5 g/dL (ref 31.5–35.7)
MCV: 90 fL (ref 79–97)
Monocytes Absolute: 0.4 10*3/uL (ref 0.1–0.9)
Monocytes: 7 %
Neutrophils Absolute: 2.6 10*3/uL (ref 1.4–7.0)
Neutrophils: 51 %
Platelets: 253 10*3/uL (ref 150–450)
RBC: 4.97 x10E6/uL (ref 4.14–5.80)
RDW: 11.7 % (ref 11.6–15.4)
WBC: 5.2 10*3/uL (ref 3.4–10.8)

## 2021-12-06 LAB — URINALYSIS
Bilirubin, UA: NEGATIVE
Glucose, UA: NEGATIVE
Ketones, UA: NEGATIVE
Leukocytes,UA: NEGATIVE
Nitrite, UA: NEGATIVE
Protein,UA: NEGATIVE
RBC, UA: NEGATIVE
Specific Gravity, UA: 1.019 (ref 1.005–1.030)
Urobilinogen, Ur: 0.2 mg/dL (ref 0.2–1.0)
pH, UA: 5.5 (ref 5.0–7.5)

## 2021-12-06 LAB — LIPID PANEL
Chol/HDL Ratio: 4.1 ratio (ref 0.0–5.0)
Cholesterol, Total: 158 mg/dL (ref 100–199)
HDL: 39 mg/dL — ABNORMAL LOW (ref >39)
LDL Chol Calc (NIH): 102 mg/dL — ABNORMAL HIGH (ref 0–99)
Triglycerides: 88 mg/dL (ref 0–149)
VLDL Cholesterol Cal: 17 mg/dL (ref 5–40)

## 2021-12-06 LAB — HEMOGLOBIN A1C
Est. average glucose Bld gHb Est-mCnc: 105 mg/dL
Hgb A1c MFr Bld: 5.3 % (ref 4.8–5.6)

## 2021-12-06 LAB — VITAMIN B1: Thiamine: 135 nmol/L (ref 66.5–200.0)

## 2021-12-06 LAB — MAGNESIUM: Magnesium: 1.9 mg/dL (ref 1.6–2.3)

## 2021-12-06 LAB — TSH: TSH: 4.45 u[IU]/mL (ref 0.450–4.500)

## 2021-12-06 LAB — VITAMIN D 25 HYDROXY (VIT D DEFICIENCY, FRACTURES): Vit D, 25-Hydroxy: 25.7 ng/mL — ABNORMAL LOW (ref 30.0–100.0)

## 2021-12-15 ENCOUNTER — Other Ambulatory Visit: Payer: Self-pay

## 2021-12-15 ENCOUNTER — Encounter: Payer: Self-pay | Admitting: Gerontology

## 2021-12-15 ENCOUNTER — Ambulatory Visit: Payer: Self-pay | Admitting: Gerontology

## 2021-12-15 VITALS — BP 109/68 | HR 78 | Temp 97.6°F | Resp 16 | Ht 74.0 in | Wt 219.4 lb

## 2021-12-15 DIAGNOSIS — E559 Vitamin D deficiency, unspecified: Secondary | ICD-10-CM

## 2021-12-15 DIAGNOSIS — M25512 Pain in left shoulder: Secondary | ICD-10-CM

## 2021-12-15 DIAGNOSIS — R946 Abnormal results of thyroid function studies: Secondary | ICD-10-CM

## 2021-12-15 MED ORDER — VITAMIN D (ERGOCALCIFEROL) 1.25 MG (50000 UNIT) PO CAPS
50000.0000 [IU] | ORAL_CAPSULE | ORAL | 0 refills | Status: AC
  Filled 2021-12-15: qty 8, 56d supply, fill #0
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
## 2021-12-16 ENCOUNTER — Other Ambulatory Visit: Payer: Self-pay

## 2022-01-08 ENCOUNTER — Other Ambulatory Visit: Payer: Self-pay

## 2022-02-09 ENCOUNTER — Encounter: Payer: Self-pay | Admitting: Rheumatology

## 2022-02-09 ENCOUNTER — Other Ambulatory Visit: Payer: Self-pay

## 2022-02-09 ENCOUNTER — Ambulatory Visit: Payer: Self-pay | Admitting: Rheumatology

## 2022-02-09 VITALS — BP 97/68 | HR 62 | Temp 98.1°F | Ht 74.0 in | Wt 213.7 lb

## 2022-02-09 DIAGNOSIS — G8929 Other chronic pain: Secondary | ICD-10-CM
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
## 2022-02-17 ENCOUNTER — Telehealth: Payer: Self-pay

## 2022-02-17 ENCOUNTER — Other Ambulatory Visit: Payer: Self-pay

## 2022-02-17 DIAGNOSIS — L719 Rosacea, unspecified: Secondary | ICD-10-CM

## 2022-02-17 MED ORDER — MINOCYCLINE HCL 100 MG PO CAPS
ORAL_CAPSULE | ORAL | 11 refills | Status: AC
  Filled 2022-02-17: qty 60, fill #0
  Filled 2022-02-18: qty 60, 30d supply, fill #0
  Filled 2022-03-24: qty 60, 30d supply, fill #1
  Filled 2022-04-28: qty 60, 30d supply, fill #2
  Filled 2022-06-01: qty 60, 30d supply, fill #3
  Filled 2022-06-30: qty 60, 30d supply, fill #4
  Filled 2022-07-30: qty 60, 30d supply, fill #5
  Filled 2022-09-03: qty 60, 30d supply, fill #6
  Filled 2022-10-11: qty 60, 30d supply, fill #7
  Filled 2022-11-17: qty 60, 30d supply, fill #8
  Filled 2022-12-21: qty 60, 30d supply, fill #9

## 2022-02-17 NOTE — Telephone Encounter (Signed)
Refill ok per K Sheffield and needs new derm Dr

## 2022-02-18 ENCOUNTER — Other Ambulatory Visit: Payer: Self-pay

## 2022-03-24 ENCOUNTER — Other Ambulatory Visit: Payer: Self-pay

## 2022-04-08 ENCOUNTER — Other Ambulatory Visit: Payer: Self-pay

## 2022-04-28 ENCOUNTER — Other Ambulatory Visit: Payer: Self-pay

## 2022-06-01 ENCOUNTER — Other Ambulatory Visit: Payer: Self-pay

## 2022-06-30 ENCOUNTER — Other Ambulatory Visit: Payer: Self-pay

## 2022-07-30 ENCOUNTER — Other Ambulatory Visit: Payer: Self-pay

## 2022-09-03 ENCOUNTER — Other Ambulatory Visit: Payer: Self-pay

## 2022-10-11 ENCOUNTER — Other Ambulatory Visit: Payer: Self-pay

## 2022-11-17 ENCOUNTER — Other Ambulatory Visit: Payer: Self-pay

## 2022-12-20 ENCOUNTER — Other Ambulatory Visit: Payer: Self-pay

## 2022-12-21 ENCOUNTER — Other Ambulatory Visit: Payer: Self-pay

## 2022-12-22 ENCOUNTER — Other Ambulatory Visit: Payer: Self-pay

## 2023-04-04 ENCOUNTER — Other Ambulatory Visit: Payer: Self-pay
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
